# Patient Record
Sex: Male | Born: 1960 | Race: Black or African American | Hispanic: No | Marital: Single | Smoking: Current every day smoker
Health system: Southern US, Community
[De-identification: ages and names within clinical notes are randomized; demographics above are authoritative.]

## PROBLEM LIST (undated history)

## (undated) HISTORY — PX: APPENDECTOMY: SHX54

---

## 2009-01-06 ENCOUNTER — Emergency Department (HOSPITAL_COMMUNITY): Admission: EM | Admit: 2009-01-06 | Discharge: 2009-01-06 | Payer: Self-pay | Admitting: Emergency Medicine

## 2011-02-12 LAB — HIV ANTIBODY (ROUTINE TESTING W REFLEX): HIV: NONREACTIVE
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
## 2017-12-21 ENCOUNTER — Other Ambulatory Visit: Payer: Self-pay

## 2017-12-21 ENCOUNTER — Encounter: Payer: Self-pay | Admitting: Emergency Medicine

## 2017-12-21 ENCOUNTER — Emergency Department
Admission: EM | Admit: 2017-12-21 | Discharge: 2017-12-21 | Disposition: A | Discharge status: Home / Self Care | Payer: Self-pay | Attending: Student in an Organized Health Care Education/Training Program | Admitting: Student in an Organized Health Care Education/Training Program

## 2017-12-21 ENCOUNTER — Emergency Department: Payer: Self-pay

## 2017-12-21 DIAGNOSIS — F17200 Nicotine dependence, unspecified, uncomplicated: Secondary | ICD-10-CM | POA: Insufficient documentation

## 2017-12-21 DIAGNOSIS — R1013 Epigastric pain: Secondary | ICD-10-CM | POA: Insufficient documentation

## 2017-12-21 LAB — COMPREHENSIVE METABOLIC PANEL
ALBUMIN: 4.6 g/dL (ref 3.5–5.0)
ALT: 24 U/L (ref 17–63)
AST: 21 U/L (ref 15–41)
Alkaline Phosphatase: 82 U/L (ref 38–126)
Anion gap: 9 (ref 5–15)
BUN: 11 mg/dL (ref 6–20)
CHLORIDE: 105 mmol/L (ref 101–111)
CO2: 25 mmol/L (ref 22–32)
Calcium: 9.5 mg/dL (ref 8.9–10.3)
Creatinine, Ser: 0.93 mg/dL (ref 0.61–1.24)
GFR calc Af Amer: >60 mL/min (ref >60)
GFR calc non Af Amer: >60 mL/min (ref >60)
GLUCOSE: 91 mg/dL (ref 65–99)
POTASSIUM: 4.3 mmol/L (ref 3.5–5.1)
Sodium: 139 mmol/L (ref 135–145)
Total Bilirubin: 0.9 mg/dL (ref 0.3–1.2)
Total Protein: 8.7 g/dL — ABNORMAL HIGH (ref 6.5–8.1)

## 2017-12-21 LAB — URINALYSIS, COMPLETE (UACMP) WITH MICROSCOPIC
BACTERIA UA: NONE SEEN
Bilirubin Urine: NEGATIVE
Glucose, UA: NEGATIVE mg/dL
Hgb urine dipstick: NEGATIVE
Ketones, ur: 5 mg/dL — AB
Leukocytes, UA: NEGATIVE
Nitrite: NEGATIVE
Protein, ur: 30 mg/dL — AB
Specific Gravity, Urine: 1.026 (ref 1.005–1.030)
pH: 6 (ref 5.0–8.0)

## 2017-12-21 LAB — CBC
HEMATOCRIT: 42.3 % (ref 40.0–52.0)
Hemoglobin: 13.6 g/dL (ref 13.0–18.0)
MCH: 23.5 pg — AB (ref 26.0–34.0)
MCHC: 32.3 g/dL (ref 32.0–36.0)
MCV: 72.6 fL — AB (ref 80.0–100.0)
Platelets: 296 10*3/uL (ref 150–440)
RBC: 5.82 MIL/uL (ref 4.40–5.90)
RDW: 14.3 % (ref 11.5–14.5)
WBC: 6.4 10*3/uL (ref 3.8–10.6)

## 2017-12-21 LAB — LIPASE, BLOOD: Lipase: 61 U/L — ABNORMAL HIGH (ref 11–51)

## 2017-12-21 MED ORDER — HYDROCODONE-ACETAMINOPHEN 5-325 MG PO TABS: 1.0000 | ORAL_TABLET | ORAL | 0 refills | Status: DC | PRN

## 2017-12-21 MED ORDER — PROMETHAZINE HCL 12.5 MG PO TABS: 12.5000 mg | ORAL_TABLET | Freq: Four times a day (QID) | ORAL | 0 refills | Status: DC | PRN

## 2017-12-21 MED ORDER — SODIUM CHLORIDE 0.9 % IV BOLUS (SEPSIS)
1000.0000 mL | Freq: Once | INTRAVENOUS | Status: AC
  Administered 2017-12-21: 1000 mL via INTRAVENOUS

## 2017-12-21 MED ORDER — HYDROCODONE-ACETAMINOPHEN 5-325 MG PO TABS
1.0000 | ORAL_TABLET | Freq: Once | ORAL | Status: AC
  Administered 2017-12-21: 1 via ORAL
  Filled 2017-12-21: qty 1

## 2017-12-21 MED ORDER — IOPAMIDOL (ISOVUE-300) INJECTION 61%
100.0000 mL | Freq: Once | INTRAVENOUS | Status: AC | PRN
  Administered 2017-12-21: 100 mL via INTRAVENOUS

## 2017-12-21 MED ORDER — PROMETHAZINE HCL 25 MG/ML IJ SOLN
12.5000 mg | Freq: Four times a day (QID) | INTRAMUSCULAR | Status: DC | PRN
  Administered 2017-12-21: 12.5 mg via INTRAVENOUS
  Filled 2017-12-21: qty 1

## 2017-12-21 NOTE — ED Provider Notes (Signed)
[facility] Emergency Department Provider Note    First MD Initiated Contact with Patient 12/21/17 [id number]     (approximate)  I have reviewed the triage vital signs and the nursing notes.   HISTORY  Chief Complaint Abdominal Pain    HPI Francisco Miller is a 57 y.o. male status post appendectomy but no other known chronic medical conditions or recent hospitalizations presents for evaluation of 2-3 weeks of progressively worsening generalized abdominal pain with feeling of fullness.  States his been taking laxatives and feels that he has some improvement after moving his bowels but symptoms do not resolve and quickly come back.  Is not noticed any blood or melena.  Denies any fevers.  No cough or shortness of breath.  Denies any dysuria, no hematuria.  History reviewed. No pertinent past medical history. History reviewed. No pertinent family history. Past Surgical History:  Procedure Laterality Date  . APPENDECTOMY     There are no active problems to display for this patient.     Prior to Admission medications   Not on File    Allergies Patient has no known allergies.    Social History Social History   Tobacco Use  . Smoking status: Current Every Day Smoker  . Smokeless tobacco: Never Used  Substance Use Topics  . Alcohol use: Yes  . Drug use: No    Review of Systems Patient denies headaches, rhinorrhea, blurry vision, numbness, shortness of breath, chest pain, edema, cough, abdominal pain, nausea, vomiting, diarrhea, dysuria, fevers, rashes or hallucinations unless otherwise stated above in HPI. ____________________________________________   PHYSICAL EXAM:  VITAL SIGNS: Vitals:   12/21/17 0738  BP: 140/81  Pulse: 87  Resp: 18  Temp: 98.4 F (36.9 C)  SpO2: 100%    Constitutional: Alert and oriented. Well appearing and in no acute distress. Eyes: Conjunctivae are normal.  Head: Atraumatic. Nose: No  congestion/rhinnorhea. Mouth/Throat: Mucous membranes are moist.   Neck: No stridor. Painless ROM.  Cardiovascular: Normal rate, regular rhythm. Grossly normal heart sounds.  Good peripheral circulation. Respiratory: Normal respiratory effort.  No retractions. Lungs CTAB. Gastrointestinal: Soft with diffuse ttp, no peritonitis. No distention. No abdominal bruits. No CVA tenderness. Musculoskeletal: No lower extremity tenderness nor edema.  No joint effusions. Neurologic:  Normal speech and language. No gross focal neurologic deficits are appreciated. No facial droop Skin:  Skin is warm, dry and intact. No rash noted. Psychiatric: Mood and affect are normal. Speech and behavior are normal.  ____________________________________________   LABS (all labs ordered are listed, but only abnormal results are displayed)  Results for orders placed or performed during the hospital encounter of 12/21/17 (from the past 24 hour(s))  Lipase, blood     Status: Abnormal   Collection Time: 12/21/17  7:43 AM  Result Value Ref Range   Lipase 61 (H) 11 - 51 U/L  Comprehensive metabolic panel     Status: Abnormal   Collection Time: 12/21/17  7:43 AM  Result Value Ref Range   Sodium 139 135 - 145 mmol/L   Potassium 4.3 3.5 - 5.1 mmol/L   Chloride 105 101 - 111 mmol/L   CO2 25 22 - 32 mmol/L   Glucose, Bld 91 65 - 99 mg/dL   BUN 11 6 - 20 mg/dL   Creatinine, Ser 9.600.93 0.61 - 1.24 mg/dL   Calcium 9.5 8.9 - 45.410.3 mg/dL   Total Protein 8.7 (H) 6.5 - 8.1 g/dL   Albumin 4.6 3.5 - 5.0 g/dL  AST 21 15 - 41 U/L   ALT 24 17 - 63 U/L   Alkaline Phosphatase 82 38 - 126 U/L   Total Bilirubin 0.9 0.3 - 1.2 mg/dL   GFR calc non Af Amer >60 >60 mL/min   GFR calc Af Amer >60 >60 mL/min   Anion gap 9 5 - 15  CBC     Status: Abnormal   Collection Time: 12/21/17  7:43 AM  Result Value Ref Range   WBC 6.4 3.8 - 10.6 K/uL   RBC 5.82 4.40 - 5.90 MIL/uL   Hemoglobin 13.6 13.0 - 18.0 g/dL   HCT 95.6 21.3 - 08.6 %    MCV 72.6 (L) 80.0 - 100.0 fL   MCH 23.5 (L) 26.0 - 34.0 pg   MCHC 32.3 32.0 - 36.0 g/dL   RDW 57.8 46.9 - 62.9 %   Platelets 296 150 - 440 K/uL  Urinalysis, Complete w Microscopic     Status: Abnormal   Collection Time: 12/21/17  7:44 AM  Result Value Ref Range   Color, Urine YELLOW (A) YELLOW   APPearance HAZY (A) CLEAR   Specific Gravity, Urine 1.026 1.005 - 1.030   pH 6.0 5.0 - 8.0   Glucose, UA NEGATIVE NEGATIVE mg/dL   Hgb urine dipstick NEGATIVE NEGATIVE   Bilirubin Urine NEGATIVE NEGATIVE   Ketones, ur 5 (A) NEGATIVE mg/dL   Protein, ur 30 (A) NEGATIVE mg/dL   Nitrite NEGATIVE NEGATIVE   Leukocytes, UA NEGATIVE NEGATIVE   RBC / HPF 0-5 0 - 5 RBC/hpf   WBC, UA 6-30 0 - 5 WBC/hpf   Bacteria, UA NONE SEEN NONE SEEN   Squamous Epithelial / LPF 0-5 (A) NONE SEEN   Mucus PRESENT    Hyaline Casts, UA PRESENT    ____________________________________________   ____________________________________________  RADIOLOGY  I personally reviewed all radiographic images ordered to evaluate for the above acute complaints and reviewed radiology reports and findings.  These findings were personally discussed with the patient.  Please see medical record for radiology report.  ____________________________________________   PROCEDURES  Procedure(s) performed:  Procedures    Critical Care performed: no ____________________________________________   INITIAL IMPRESSION / ASSESSMENT AND PLAN / ED COURSE  Pertinent labs & imaging results that were available during my care of the patient were reviewed by me and considered in my medical decision making (see chart for details).  DDX: colitis, appy, uti, mass, sbo, enteritis, constipation  [name] is a 57 y.o. who presents to the ED with symptoms as described above.  Does have some epigastric tenderness to palpation based on duration of symptoms will order CT imaging as well as blood work for the above differential.  Will  provide IV fluids as well as pain medication and IV antiemetics.  Clinical Course as of Dec 21 1522  Tue Dec 21, 2017  1038 Patient with CT evidence of probable viral colitis with mild pancreatitis supported by marginally elevated lipase.  Will give IV fluids   [PR]    Clinical Course User Index [PR] Willy Eddy, MD    Patient reassessed and is tolerating oral hydration and pain is resolved.  At this point do believe he is stable and appropriate for outpatient management. ____________________________________________   FINAL CLINICAL IMPRESSION(S) / ED DIAGNOSES  Final diagnoses:  Epigastric pain      NEW MEDICATIONS STARTED DURING THIS VISIT:  New Prescriptions   No medications on file     Note:  This document was prepared using  [employer] and may include unintentional dictation errors.    Willy Eddy, MD 12/21/17 1525

## 2017-12-21 NOTE — Discharge Instructions (Signed)

## 2017-12-21 NOTE — ED Notes (Signed)
Pt states he is nauseated and doesn't want to eat the provided crackers and water. See mar for administered medication

## 2017-12-21 NOTE — ED Notes (Signed)
[facility]

## 2017-12-21 NOTE — ED Notes (Signed)
Patient transported to CT 

## 2017-12-21 NOTE — ED Notes (Signed)
First Nurse Note:  Patient complaining of abdominal pain with constipation, states he has a "knot" in his upper abdomen.  Taking MOM OTC with minimal relief.  Alert and oriented.

## 2017-12-21 NOTE — ED Triage Notes (Signed)
Pt here mostly lower abdominal pain that he thinks is from constipation. Last bowel movement this morning, has been using MOM and it seems to help but still gets this pain in lower abdomen.  Denies vomiting. Denies fevers.  Will report lower abdominal pain with constipation sx but then describe generalized abdominal pain as well.  Difficult to get accurate picture at this time.  VSS. Ambulatory.

## 2019-09-16 IMAGING — CT CT ABD-PELV W/ CM
2 of 5 series · 15 of 46 positions shown, 17 images · IV contrast (APPLIED)
Comparison: None.

CLINICAL DATA: Generalized abdominal pain.

EXAM:
CT ABDOMEN AND PELVIS WITH CONTRAST
TECHNIQUE: Multidetector CT imaging of the abdomen and pelvis was performed
using the standard protocol following bolus administration of
intravenous contrast.
CONTRAST:  100mL 4CXHST-Y66 IOPAMIDOL (4CXHST-Y66) INJECTION 61%

[Series 2: routine abd/pel with · axial · 0.72mm/px · z∈[-1036,-636]mm · 12 of 91 slices shown, 14 images]
[im 6/91  soft-tissue]
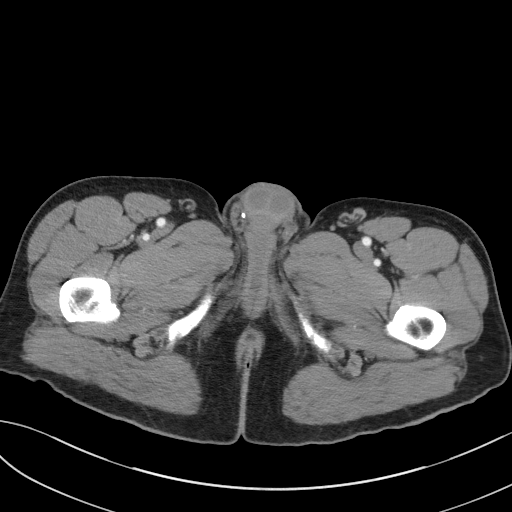
[im 6/91  bone]
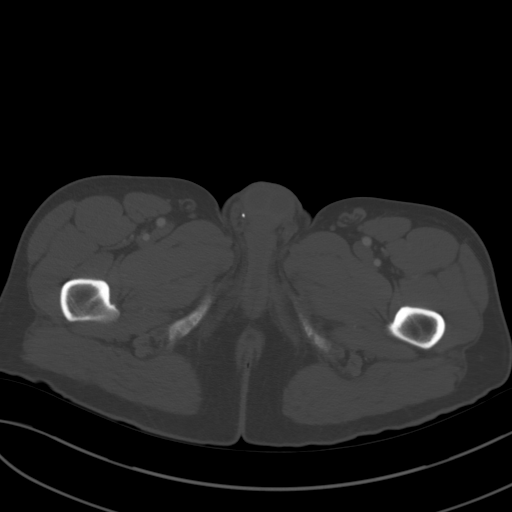
[im 16/91  soft-tissue]
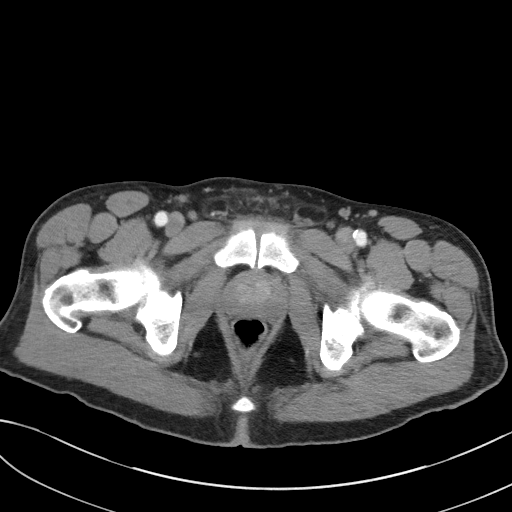
[im 21/91  soft-tissue]
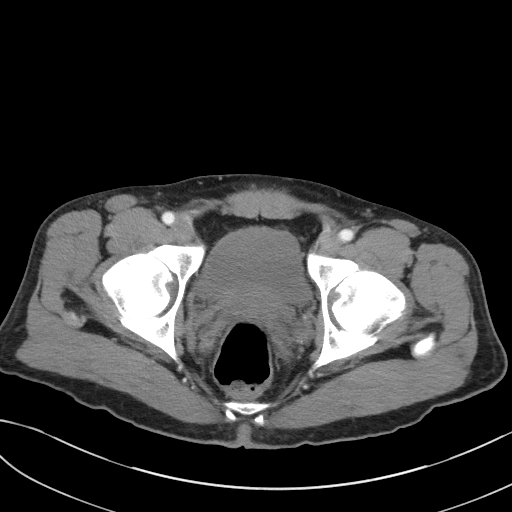
[im 26/91  soft-tissue]
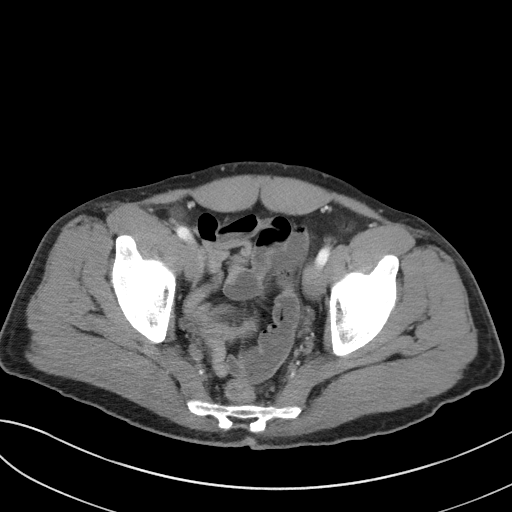
[im 36/91  soft-tissue]
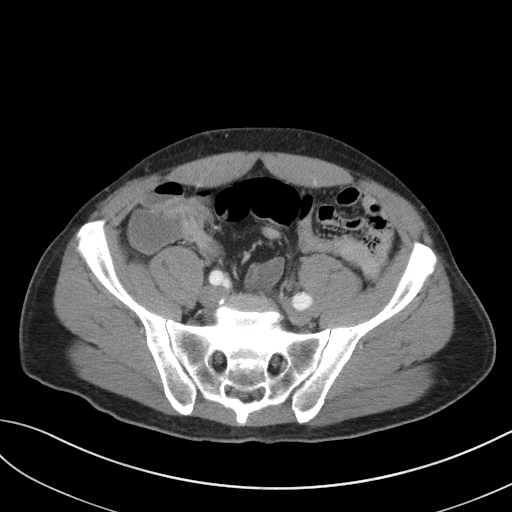
[im 41/91  soft-tissue]
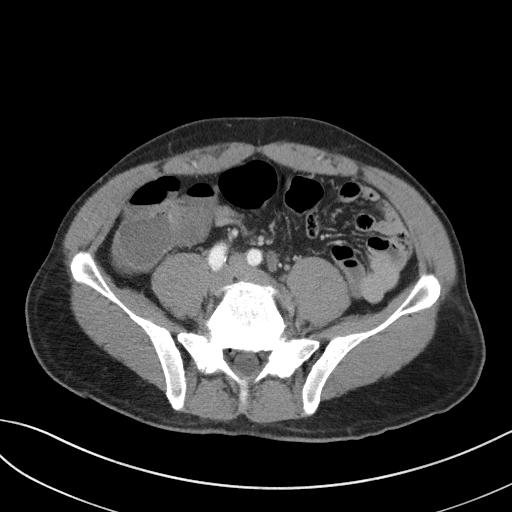
[im 51/91  soft-tissue]
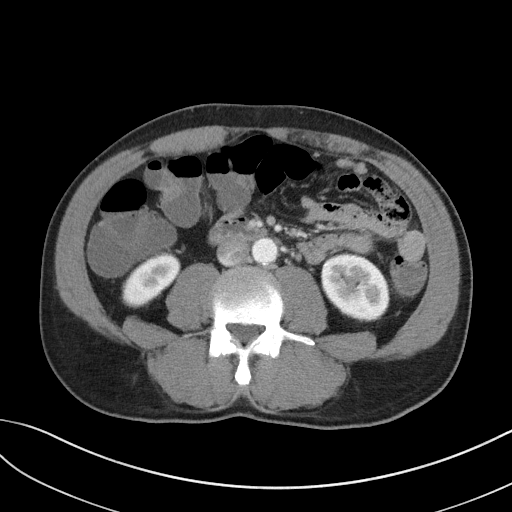
[im 56/91  soft-tissue]
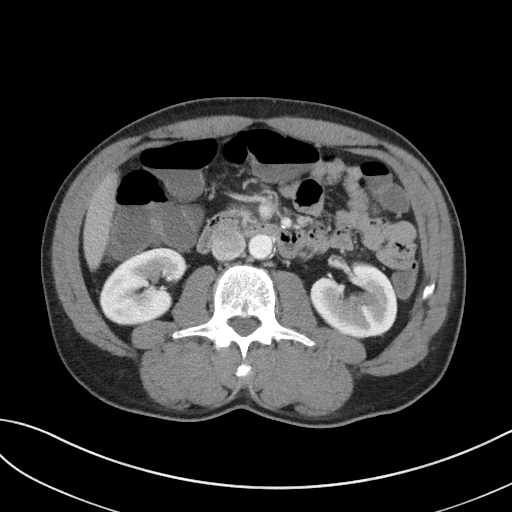
[im 66/91  soft-tissue]
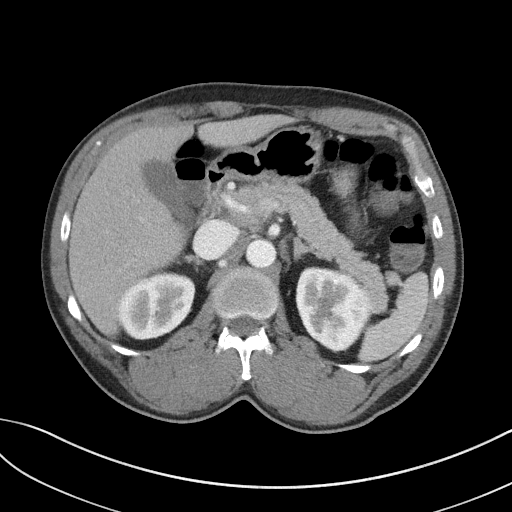
[im 66/91  bone]
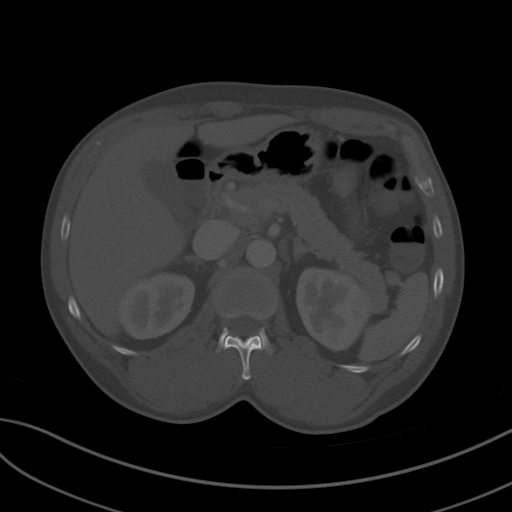
[im 71/91  soft-tissue]
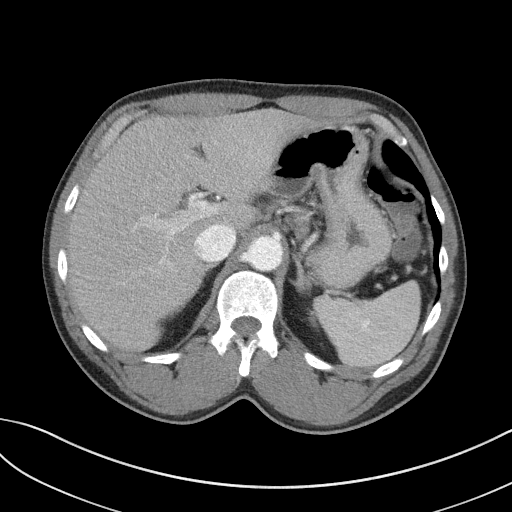
[im 76/91  soft-tissue]
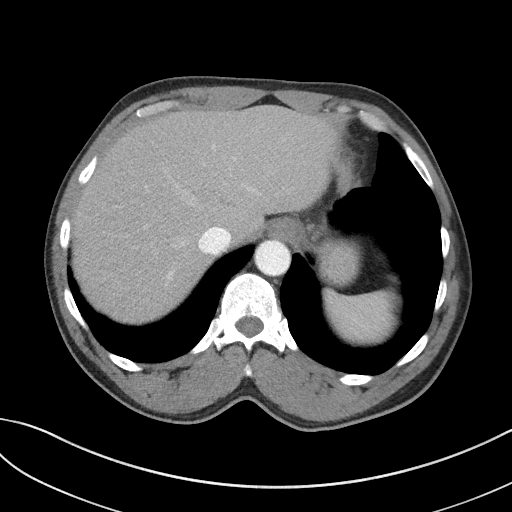
[im 86/91  soft-tissue]
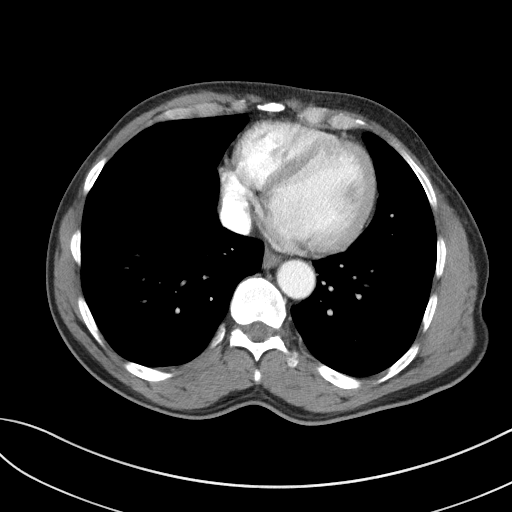

[Series 5: coronal st · coronal · 0.75mm/px · 3 of 89 slices shown]
[im 30/89  soft-tissue]
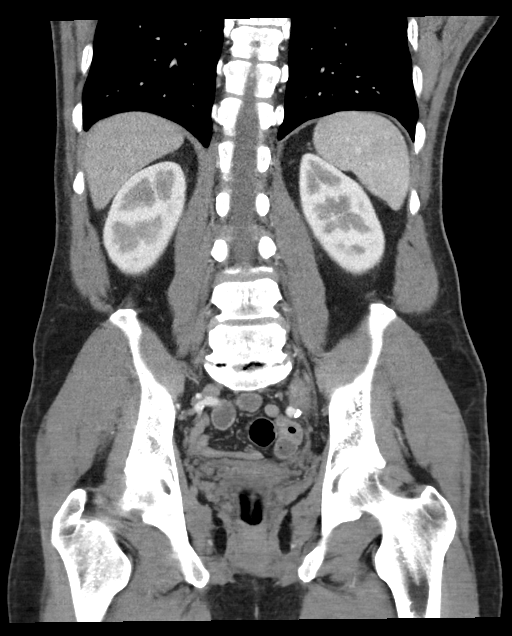
[im 40/89  soft-tissue]
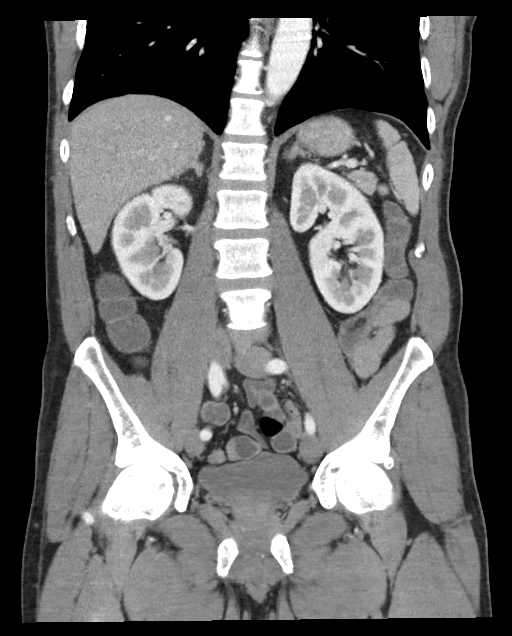
[im 49/89  soft-tissue]
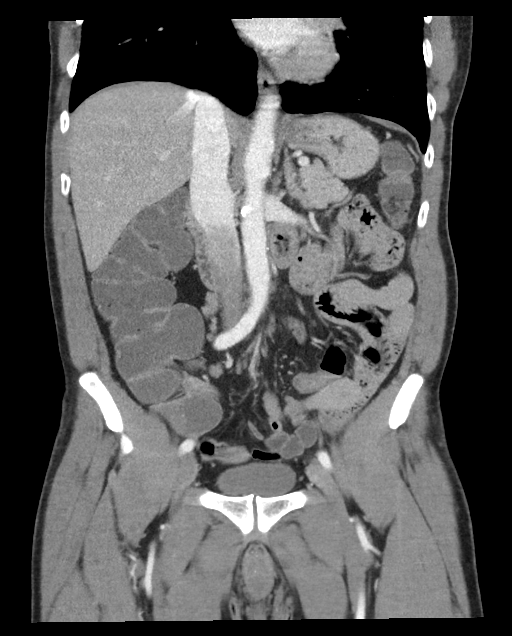

[15 of 46 positions shown; findings below may reference images not displayed]

FINDINGS: Lower chest: The lung bases are clear of acute process. No pleural
effusion or pulmonary lesions. The heart is normal in size. No
pericardial effusion. The distal esophagus and aorta are
unremarkable.

Hepatobiliary: No focal hepatic lesions or intrahepatic biliary
dilatation. The gallbladder is normal. No common bile duct
dilatation.

Pancreas: The pancreatic head appears slightly enlarged and possibly
edematous. There also appears to be some mild inflammation around
the pancreatic head with some blurring of the fat. Possible mild
acute focal pancreatitis. Recommend correlation with clinical
findings. No masses identified. No pancreatic ductal dilatation.

Spleen: Normal size.  No focal lesions.

Adrenals/Urinary Tract: The adrenal glands and kidneys are normal.
The bladder is normal.

Stomach/Bowel: The stomach, duodenum and small bowel are
unremarkable. No acute inflammatory changes, mass lesions or
obstructive findings. The colon is distended and fluid-filled,
typical findings for diarrhea but I do not see any wall thickening
or pericolonic inflammatory changes. The terminal ileum is
unremarkable. The appendix is surgically absent.

Vascular/Lymphatic: Scattered atherosclerotic calcifications
involving the aorta and iliac arteries but no aneurysm or
dissection. The branch vessels are patent. The major venous
structures are patent.

Small scattered mesenteric and retroperitoneal lymph nodes but no
mass or overt adenopathy the.

Reproductive: The prostate gland and seminal vesicles are
unremarkable.

Other: No pelvic mass or adenopathy. No free pelvic fluid
collections. No inguinal mass or adenopathy. No abdominal wall
hernia or subcutaneous lesions.

Musculoskeletal: The no significant bony findings. Advanced
degenerative disc disease noted at L5-S1
IMPRESSION: 1. Findings suspicious for mild pancreatitis involving the
pancreatic head. Recommend clinical correlation.
2. Distended fluid-filled colon typically seen with diarrhea. No
obvious acute colitis.
3. No abdominal mass or lymphadenopathy.

## 2024-03-17 NOTE — Progress Notes (Unsigned)
 Sleep Medicine   Office Visit  Patient Name: Francisco Miller DOB: June 06, 1961 MRN [id number]    Chief Complaint: history of OSA   Brief History:  [name] presents for an initial consult for sleep evaluation and to establish care. Patient has a 11 year history of sleep apnea but has not used a pap since 2016. Sleep quality is fair. This is noted most night. The patient's bed partner reports  snoring, gasping, witnessed apneic pauses at night. The patient relates the following symptoms: some brain fog and fatigue are also present. The patient works nocturnal hours and goes to sleep at 1000 am and wakes up at 0200 pm.  Sleep quality is the same when outside home environment.  Patient has noted some movement of his legs at night that would disrupt his sleep.  The patient  relates some sleep talking as unusual behavior during the night.  The patient relates no history of psychiatric problems. The Epworth Sleepiness Score is 5 out of 24 .  The patient relates  Cardiovascular risk factors include: none.     ROS  General: (-) fever, (-) chills, (-) night sweat Nose and Sinuses: (-) nasal stuffiness or itchiness, (-) postnasal drip, (-) nosebleeds, (-) sinus trouble. Mouth and Throat: (-) sore throat, (-) hoarseness. Neck: (-) swollen glands, (-) enlarged thyroid, (-) neck pain. Respiratory: - cough, - shortness of breath, - wheezing. Neurologic: - numbness, - tingling. Psychiatric: - anxiety, - depression Sleep behavior: -sleep paralysis -hypnogogic hallucinations -dream enactment      -vivid dreams -cataplexy -night terrors -sleep walking   Current Medication: Outpatient Encounter Medications as of 03/20/2024  Medication Sig   [DISCONTINUED] HYDROcodone -acetaminophen  (NORCO) 5-325 MG tablet Take 1 tablet by mouth every 4 (four) hours as needed for moderate pain.   [DISCONTINUED] promethazine  (PHENERGAN ) 12.5 MG tablet Take 1 tablet (12.5 mg total) by mouth every 6 (six) hours as needed for  nausea or vomiting.   No facility-administered encounter medications on file as of 03/20/2024.    Surgical History: Past Surgical History:  Procedure Laterality Date   APPENDECTOMY      Medical History: History reviewed. No pertinent past medical history.  Family History: Non contributory to the present illness  Social History: Social History   Socioeconomic History   Marital status: Single    Spouse name: Not on file   Number of children: Not on file   Years of education: Not on file   Highest education level: Not on file  Occupational History   Not on file  Tobacco Use   Smoking status: Every Day   Smokeless tobacco: Never  Substance and Sexual Activity   Alcohol use: Yes   Drug use: No   Sexual activity: Not on file  Other Topics Concern   Not on file  Social History Narrative   Not on file   Social Drivers of Health   Financial Resource Strain: Not on file  Food Insecurity: Not on file  Transportation Needs: Not on file  Physical Activity: Not on file  Stress: Not on file  Social Connections: Not on file  Intimate Partner Violence: Not on file    Vital Signs: Blood pressure 114/81, pulse (!) 59, resp. rate 16, height 5\' 7"  (1.702 m), weight 149 lb (67.6 kg), SpO2 98%. Body mass index is 23.34 kg/m.   Examination: General Appearance: The patient is well-developed, well-nourished, and in no distress. Neck Circumference: 38 cm Skin: Gross inspection of skin unremarkable. Head: normocephalic, no gross deformities. Eyes:  no gross deformities noted. ENT: ears appear grossly normal Neurologic: Alert and oriented. No involuntary movements.    STOP BANG RISK ASSESSMENT S (snore) Have you been told that you snore?     YES   T (tired) Are you often tired, fatigued, or sleepy during the day?   YES  O (obstruction) Do you stop breathing, choke, or gasp during sleep? YES   P (pressure) Do you have or are you being treated for high blood pressure? NO    B (BMI) Is your body index greater than 35 kg/m? NO   A (age) Are you 63 years old or older? YES   N (neck) Do you have a neck circumference greater than 16 inches?   NO   G (gender) Are you a male? YES   TOTAL STOP/BANG "YES" ANSWERS 5                                                               A STOP-Bang score of 2 or less is considered low risk, and a score of 5 or more is high risk for having either moderate or severe OSA. For people who score 3 or 4, doctors may need to perform further assessment to determine how likely they are to have OSA.         EPWORTH SLEEPINESS SCALE:  Scale:  (0)= no chance of dozing; (1)= slight chance of dozing; (2)= moderate chance of dozing; (3)= high chance of dozing  Chance  Situtation    Sitting and reading: 0    Watching TV: 1    Sitting Inactive in public: 1    As a passenger in car: 0      Lying down to rest: 2    Sitting and talking: 1    Sitting quielty after lunch: 0    In a car, stopped in traffic: 0   TOTAL SCORE:   5 out of 24    SLEEP STUDIES:  02/14/16 Split study AHI 111.6 o2 nadir was 77%.    LABS: No results found for this or any previous visit (from the past 2160 hours).  Radiology: CT ABDOMEN PELVIS W CONTRAST Result Date: 12/21/2017 CLINICAL DATA:  Generalized abdominal pain. EXAM: CT ABDOMEN AND PELVIS WITH CONTRAST TECHNIQUE: Multidetector CT imaging of the abdomen and pelvis was performed using the standard protocol following bolus administration of intravenous contrast. CONTRAST:  100mL ISOVUE -300 IOPAMIDOL  (ISOVUE -300) INJECTION 61% COMPARISON:  None. FINDINGS: Lower chest: The lung bases are clear of acute process. No pleural effusion or pulmonary lesions. The heart is normal in size. No pericardial effusion. The distal esophagus and aorta are unremarkable. Hepatobiliary: No focal hepatic lesions or intrahepatic biliary dilatation. The gallbladder is normal. No common bile duct dilatation. Pancreas:  The pancreatic head appears slightly enlarged and possibly edematous. There also appears to be some mild inflammation around the pancreatic head with some blurring of the fat. Possible mild acute focal pancreatitis. Recommend correlation with clinical findings. No masses identified. No pancreatic ductal dilatation. Spleen: Normal size.  No focal lesions. Adrenals/Urinary Tract: The adrenal glands and kidneys are normal. The bladder is normal. Stomach/Bowel: The stomach, duodenum and small bowel are unremarkable. No acute inflammatory changes, mass lesions or obstructive findings. The colon is distended and fluid-filled, typical findings  for diarrhea but I do not see any wall thickening or pericolonic inflammatory changes. The terminal ileum is unremarkable. The appendix is surgically absent. Vascular/Lymphatic: Scattered atherosclerotic calcifications involving the aorta and iliac arteries but no aneurysm or dissection. The branch vessels are patent. The major venous structures are patent. Small scattered mesenteric and retroperitoneal lymph nodes but no mass or overt adenopathy the. Reproductive: The prostate gland and seminal vesicles are unremarkable. Other: No pelvic mass or adenopathy. No free pelvic fluid collections. No inguinal mass or adenopathy. No abdominal wall hernia or subcutaneous lesions. Musculoskeletal: The no significant bony findings. Advanced degenerative disc disease noted at L5-S1 IMPRESSION: 1. Findings suspicious for mild pancreatitis involving the pancreatic head. Recommend clinical correlation. 2. Distended fluid-filled colon typically seen with diarrhea. No obvious acute colitis. 3. No abdominal mass or lymphadenopathy. Electronically Signed   By: Marrian Siva M.D.   On: 12/21/2017 10:32    No results found.  No results found.    Assessment and Plan: Patient Active Problem List   Diagnosis Date Noted   OSA (obstructive sleep apnea) 03/20/2024   Smoker 03/20/2024   1. OSA  (obstructive sleep apnea) (Primary) PLAN OSA:   Patient evaluation suggests high risk of sleep disordered breathing due to history of severe OSA based on prior sleep studies Suggest: PSG  to assess the patient's sleep disordered breathing.    2. Smoker Smoking cessation counseling: Pt acknowledges the risks of long term smoking, she will try to quite smoking. Options for different medications including nicotine products, chewing gum, patch etc, Wellbutrin and Chantix is discussed Goal and date of compete cessation is discussed Total time spent in smoking cessation is 10 min.      General Counseling: I have discussed the findings of the evaluation and examination with Hewitt Lou.  I have also discussed any further diagnostic evaluation thatmay be needed or ordered today. Joquan verbalizes understanding of the findings of todays visit. We also reviewed his medications today and discussed drug interactions and side effects including but not limited excessive drowsiness and altered mental states. We also discussed that there is always a risk not just to him but also people around him. he has been encouraged to call the office with any questions or concerns that should arise related to todays visit.  No orders of the defined types were placed in this encounter.       I have personally obtained a history, evaluated the patient, evaluated pertinent data, formulated the assessment and plan and placed orders.  This patient was seen today by Louann Rous, PA-C in collaboration with Dr. Cam Cava.    Cordie Deters, MD [facility] Diplomate ABMS Pulmonary and Critical Care Medicine Sleep medicine

## 2024-03-20 ENCOUNTER — Ambulatory Visit (INDEPENDENT_AMBULATORY_CARE_PROVIDER_SITE_OTHER): Admitting: Internal Medicine

## 2024-03-20 VITALS — BP 114/81 | HR 59 | Resp 16 | Ht 67.0 in | Wt 149.0 lb

## 2024-03-20 DIAGNOSIS — F17200 Nicotine dependence, unspecified, uncomplicated: Secondary | ICD-10-CM | POA: Insufficient documentation

## 2024-03-20 DIAGNOSIS — G4733 Obstructive sleep apnea (adult) (pediatric): Secondary | ICD-10-CM | POA: Insufficient documentation

## 2024-03-20 NOTE — Patient Instructions (Signed)
 Living With Sleep Apnea Sleep apnea is a condition that affects your breathing while you're sleeping. Your tongue or the tissue in your throat may block the flow of air while you sleep. You may have shallow breathing or stop breathing for short periods of time. The breaks in breathing interrupt the deep sleep that you need to feel rested. Even if you don't wake up from the gaps in breathing, you may feel tired during the day. People with sleep apnea may snore loudly. You may have a headache in the morning and feel anxious or depressed. How can sleep apnea affect me? Sleep apnea increases your chances of being very tired during the day. This is called daytime fatigue. Sleep apnea can also increase your risk of: Heart attack. Stroke. Obesity. Type 2 diabetes. Heart failure. Irregular heartbeat. High blood pressure. If you are very tired during the day, you may be more likely to: Not do well in school or at work. Fall asleep while driving. Have trouble paying attention. Develop depression or anxiety. Have problems having sex. This is called sexual dysfunction. What actions can I take to manage sleep apnea? Sleep apnea treatment  If you were given a device to open your airway while you sleep, use it only as told by your health care provider. You may be given: An oral appliance. This is a mouthpiece that shifts your lower jaw forward. A continuous positive airway pressure (CPAP) device. This blows air through a mask. A nasal expiratory positive airway pressure (EPAP) device. This has valves that you put into each nostril. A bi-level positive airway pressure (BIPAP) device. This blows air through a mask when you breathe in and breathe out. You may need surgery if other treatments don't work for you. Sleep habits Go to sleep and wake up at the same time every day. This helps set your internal clock for sleeping. If you stay up later than usual on weekends, try to get up in the morning within 2  hours of the time you usually wake up. Try to get at least 7-9 hours of sleep each night. Stop using a computer, tablet, and mobile phone a few hours before bedtime. Do not take long naps during the day. If you nap, limit it to 30 minutes. Have a relaxing bedtime routine. Reading or listening to music may relax you and help you sleep. Use your bedroom only for sleep. Keep your television and computer out of your bedroom. Keep your bedroom cool, dark, and quiet. Use a supportive mattress and pillows. Follow your provider's instructions for other changes to sleep habits. Nutrition Do not eat big meals in the evening. Do not have caffeine in the later part of the day. The effects of caffeine can last for more than 5 hours. Follow your provider's instructions for any changes to what you eat and drink. Lifestyle Do not drink alcohol before bedtime. Alcohol can cause you to fall asleep at first, but then it can cause you to wake up in the middle of the night and have trouble getting back to sleep. Do not smoke, vape, or use nicotine or tobacco. Medicines Take over-the-counter and prescription medicines only as told by your provider. Do not use over-the-counter sleep medicine. You may become dependent on this medicine, and it can make sleep apnea worse. Do not take medicines, such as sedatives and narcotics, unless told to by your provider. Activity Exercise on most days, but avoid exercising in the evening. Exercising near bedtime can interfere with sleeping.  If possible, spend time outside every day. Natural light helps with your internal clock. General information Lose weight if you need to. Stay at a healthy weight. If you are having surgery, make sure to tell your provider that you have sleep apnea. You may need to bring your device with you. Keep all follow-up visits. Your provider will want to check on your condition. Where to find more information National Heart, Lung, and Blood  Institute: [employer] This information is not intended to replace advice given to you by your health care provider. Make sure you discuss any questions you have with your health care provider. Document Revised: 02/10/2023 Document Reviewed: 02/10/2023 Elsevier Patient Education  2024 [employer].

## 2024-06-07 ENCOUNTER — Encounter

## 2024-06-28 ENCOUNTER — Encounter

## 2024-07-05 ENCOUNTER — Ambulatory Visit

## 2024-07-05 DIAGNOSIS — D122 Benign neoplasm of ascending colon: Secondary | ICD-10-CM | POA: Diagnosis not present

## 2024-07-05 DIAGNOSIS — K2950 Unspecified chronic gastritis without bleeding: Secondary | ICD-10-CM | POA: Diagnosis not present

## 2024-07-05 DIAGNOSIS — K640 First degree hemorrhoids: Secondary | ICD-10-CM | POA: Diagnosis not present

## 2024-07-05 DIAGNOSIS — K3189 Other diseases of stomach and duodenum: Secondary | ICD-10-CM | POA: Diagnosis not present

## 2024-07-05 DIAGNOSIS — K6389 Other specified diseases of intestine: Secondary | ICD-10-CM | POA: Diagnosis not present

## 2024-07-05 DIAGNOSIS — D124 Benign neoplasm of descending colon: Secondary | ICD-10-CM | POA: Diagnosis not present

## 2024-07-05 DIAGNOSIS — B9681 Helicobacter pylori [H. pylori] as the cause of diseases classified elsewhere: Secondary | ICD-10-CM | POA: Diagnosis not present

## 2024-08-11 NOTE — Progress Notes (Unsigned)
 [facility] [address] [place], [place] [address]  Pulmonary Sleep Medicine   Office Visit Note  Patient Name: Francisco Miller DOB: 1961/09/17 MRN [id number]    Chief Complaint: Obstructive Sleep Apnea visit  Brief History:  Shammond is seen today for a follow up visit for CPAP@ 8 cmH2O. The patient has a 11 year history of sleep apnea. Patient is not using PAP nightly.  The patient feels rested after sleeping with PAP.  The patient reports benefiting from PAP use. Reported sleepiness is improved and the Epworth Sleepiness Score is 3 out of 24. The patient does take naps. The patient complains of the following: pt works nocturnal hours and will sometimes forget to wear his PAP before dozing off. Patient will attempt to meet compliance by next follow up visit. The compliance download shows 43% compliance with an average use time of 4 hours 41 minutes. The AHI is 11.  The patient does not complain of limb movements disrupting sleep. The patient continues to require PAP therapy in order to eliminate sleep apnea.   ROS  General: (-) fever, (-) chills, (-) night sweat Nose and Sinuses: (-) nasal stuffiness or itchiness, (-) postnasal drip, (-) nosebleeds, (-) sinus trouble. Mouth and Throat: (-) sore throat, (-) hoarseness. Neck: (-) swollen glands, (-) enlarged thyroid, (-) neck pain. Respiratory: - cough, - shortness of breath, - wheezing. Neurologic: - numbness, - tingling. Psychiatric: - anxiety, - depression   Current Medication: No outpatient encounter medications on file as of 08/14/2024.   No facility-administered encounter medications on file as of 08/14/2024.    Surgical History: Past Surgical History:  Procedure Laterality Date   APPENDECTOMY      Medical History: No past medical history on file.  Family History: Non contributory to the present illness  Social History: Social History   Socioeconomic History   Marital status: Single    Spouse  name: Not on file   Number of children: Not on file   Years of education: Not on file   Highest education level: Not on file  Occupational History   Not on file  Tobacco Use   Smoking status: Every Day   Smokeless tobacco: Never  Substance and Sexual Activity   Alcohol use: Yes   Drug use: No   Sexual activity: Not on file  Other Topics Concern   Not on file  Social History Narrative   Not on file   Social Drivers of Health   Financial Resource Strain: Not on file  Food Insecurity: Not on file  Transportation Needs: Not on file  Physical Activity: Not on file  Stress: Not on file  Social Connections: Not on file  Intimate Partner Violence: Not on file    Vital Signs: Blood pressure 126/86, pulse 91, resp. rate 16, height 5' 7 (1.702 m), SpO2 97%. Body mass index is 23.34 kg/m.    Examination: General Appearance: The patient is well-developed, well-nourished, and in no distress. Neck Circumference: 38 cm Skin: Gross inspection of skin unremarkable. Head: normocephalic, no gross deformities. Eyes: no gross deformities noted. ENT: ears appear grossly normal Neurologic: Alert and oriented. No involuntary movements.  STOP BANG RISK ASSESSMENT S (snore) Have you been told that you snore?     NO   T (tired) Are you often tired, fatigued, or sleepy during the day?   YES  O (obstruction) Do you stop breathing, choke, or gasp during sleep? NO   P (pressure) Do you have or are you  being treated for high blood pressure? NO   B (BMI) Is your body index greater than 35 kg/m? NO   A (age) Are you 63 years old or older? YES   N (neck) Do you have a neck circumference greater than 16 inches?   NO   G (gender) Are you a male? YES   TOTAL STOP/BANG "YES" ANSWERS 3       A STOP-Bang score of 2 or less is considered low risk, and a score of 5 or more is high risk for having either moderate or severe OSA. For people who score 3 or 4, doctors may need to perform further  assessment to determine how likely they are to have OSA.         EPWORTH SLEEPINESS SCALE:  Scale:  (0)= no chance of dozing; (1)= slight chance of dozing; (2)= moderate chance of dozing; (3)= high chance of dozing  Chance  Situtation    Sitting and reading: 0    Watching TV: 1    Sitting Inactive in public: 0    As a passenger in car: 0      Lying down to rest: 2    Sitting and talking: 0    Sitting quielty after lunch: 0    In a car, stopped in traffic: 0   TOTAL SCORE:   3 out of 24    SLEEP STUDIES:  Split Study (02/2016) AHI 111.6/Hr, min SpO2 77%  PSG (04/2024) AHI 24.3/Hr, Supine AHI 48.1/Hr, min SpO2 90% Titration (05/2024) CPAP@ 8 cmH2O   CPAP COMPLIANCE DATA:  Date Range: 06/15/2024-08/07/2024  Average Daily Use: 4 hours 41 minutes  Median Use: 4 hours 21 minutes  Compliance for > 4 Hours: 43%  AHI: 11 respiratory events per hour  Days Used: 41/54 days  Mask Leak: 0.9  95th Percentile Pressure: 8         LABS: No results found for this or any previous visit (from the past 2160 hours).  Radiology: CT ABDOMEN PELVIS W CONTRAST Result Date: 12/21/2017 CLINICAL DATA:  Generalized abdominal pain. EXAM: CT ABDOMEN AND PELVIS WITH CONTRAST TECHNIQUE: Multidetector CT imaging of the abdomen and pelvis was performed using the standard protocol following bolus administration of intravenous contrast. CONTRAST:  100mL ISOVUE -300 IOPAMIDOL  (ISOVUE -300) INJECTION 61% COMPARISON:  None. FINDINGS: Lower chest: The lung bases are clear of acute process. No pleural effusion or pulmonary lesions. The heart is normal in size. No pericardial effusion. The distal esophagus and aorta are unremarkable. Hepatobiliary: No focal hepatic lesions or intrahepatic biliary dilatation. The gallbladder is normal. No common bile duct dilatation. Pancreas: The pancreatic head appears slightly enlarged and possibly edematous. There also appears to be some mild inflammation  around the pancreatic head with some blurring of the fat. Possible mild acute focal pancreatitis. Recommend correlation with clinical findings. No masses identified. No pancreatic ductal dilatation. Spleen: Normal size.  No focal lesions. Adrenals/Urinary Tract: The adrenal glands and kidneys are normal. The bladder is normal. Stomach/Bowel: The stomach, duodenum and small bowel are unremarkable. No acute inflammatory changes, mass lesions or obstructive findings. The colon is distended and fluid-filled, typical findings for diarrhea but I do not see any wall thickening or pericolonic inflammatory changes. The terminal ileum is unremarkable. The appendix is surgically absent. Vascular/Lymphatic: Scattered atherosclerotic calcifications involving the aorta and iliac arteries but no aneurysm or dissection. The branch vessels are patent. The major venous structures are patent. Small scattered mesenteric and retroperitoneal lymph nodes but no mass or  overt adenopathy the. Reproductive: The prostate gland and seminal vesicles are unremarkable. Other: No pelvic mass or adenopathy. No free pelvic fluid collections. No inguinal mass or adenopathy. No abdominal wall hernia or subcutaneous lesions. Musculoskeletal: The no significant bony findings. Advanced degenerative disc disease noted at L5-S1 IMPRESSION: 1. Findings suspicious for mild pancreatitis involving the pancreatic head. Recommend clinical correlation. 2. Distended fluid-filled colon typically seen with diarrhea. No obvious acute colitis. 3. No abdominal mass or lymphadenopathy. Electronically Signed   By: MYRTIS Stammer M.D.   On: 12/21/2017 10:32    No results found.  No results found.    Assessment and Plan: Patient Active Problem List   Diagnosis Date Noted   CPAP use counseling 08/14/2024   OSA (obstructive sleep apnea) 03/20/2024   Smoker 03/20/2024    1. OSA (obstructive sleep apnea) (Primary) The patient does tolerate PAP and reports   benefit from PAP use. His apnea is not optimally controlled. I will change his pressure to APAP 8-15 with a 2 week download. The patient was reminded how to clean equipment and advised to replace supplies routinely. The patient was also counselled on smoking. The compliance is poor, he was coached on increasing use. The AHI is 11.   OSA- not optimally controlled. Change to APAP 8-15. 2 week download. F/u 14m  2. CPAP use counseling CPAP Counseling: had a lengthy discussion with the patient regarding the importance of PAP therapy in management of the sleep apnea. Patient appears to understand the risk factor reduction and also understands the risks associated with untreated sleep apnea. Patient will try to make a good faith effort to remain compliant with therapy. Also instructed the patient on proper cleaning of the device including the water must be changed daily if possible and use of distilled water is preferred. Patient understands that the machine should be regularly cleaned with appropriate recommended cleaning solutions that do not damage the PAP machine for example given white vinegar and water rinses. Other methods such as ozone treatment may not be as good as these simple methods to achieve cleaning.    General Counseling: I have discussed the findings of the evaluation and examination with Ubaldo.  I have also discussed any further diagnostic evaluation thatmay be needed or ordered today. Yash verbalizes understanding of the findings of todays visit. We also reviewed his medications today and discussed drug interactions and side effects including but not limited excessive drowsiness and altered mental states. We also discussed that there is always a risk not just to him but also people around him. he has been encouraged to call the office with any questions or concerns that should arise related to todays visit.  No orders of the defined types were placed in this encounter.       I have  personally obtained a history, examined the patient, evaluated laboratory and imaging results, formulated the assessment and plan and placed orders. This patient was seen today by Lauraine Lay, PA-C in collaboration with Dr. Elfreda Bathe.   Elfreda DELENA Bathe, MD [facility] Diplomate ABMS Pulmonary Critical Care Medicine and Sleep Medicine

## 2024-08-14 ENCOUNTER — Ambulatory Visit (INDEPENDENT_AMBULATORY_CARE_PROVIDER_SITE_OTHER): Admitting: Internal Medicine

## 2024-08-14 VITALS — BP 126/86 | HR 91 | Resp 16 | Ht 67.0 in

## 2024-08-14 DIAGNOSIS — Z7189 Other specified counseling: Secondary | ICD-10-CM | POA: Diagnosis not present

## 2024-08-14 DIAGNOSIS — G4733 Obstructive sleep apnea (adult) (pediatric): Secondary | ICD-10-CM

## 2024-08-14 NOTE — Patient Instructions (Signed)

## 2024-11-11 NOTE — Progress Notes (Unsigned)
 [facility] [address] [place], [place] [address]  Pulmonary Sleep Medicine   Office Visit Note  Patient Name: Francisco Miller DOB: 02/07/1961 MRN [id number]    Chief Complaint: Obstructive Sleep Apnea visit  Brief History:  Francisco Miller is seen today for a 3 month follow up on APAP @ 8-15 cmH2O. The patient has a 11 year history of sleep apnea. Patient is not consistently using PAP nightly.  The patient feels *** after sleeping with PAP.  The patient reports *** from PAP use. Reported sleepiness is  *** and the Epworth Sleepiness Score is *** out of 24. The patient *** take naps. The patient complains of the following: ***  The compliance download shows 69% compliance with an average use time of 4 hours 55 minutes. The AHI is 3.3  The patient *** of limb movements disrupting sleep.  ROS  General: (-) fever, (-) chills, (-) night sweat Nose and Sinuses: (-) nasal stuffiness or itchiness, (-) postnasal drip, (-) nosebleeds, (-) sinus trouble. Mouth and Throat: (-) sore throat, (-) hoarseness. Neck: (-) swollen glands, (-) enlarged thyroid, (-) neck pain. Respiratory: *** cough, *** shortness of breath, *** wheezing. Neurologic: *** numbness, *** tingling. Psychiatric: *** anxiety, *** depression   Current Medication: No outpatient encounter medications on file as of 11/13/2024.   No facility-administered encounter medications on file as of 11/13/2024.    Surgical History: Past Surgical History:  Procedure Laterality Date   APPENDECTOMY      Medical History: No past medical history on file.  Family History: Non contributory to the present illness  Social History: Social History   Socioeconomic History   Marital status: Single    Spouse name: Not on file   Number of children: Not on file   Years of education: Not on file   Highest education level: Not on file  Occupational History   Not on file  Tobacco Use   Smoking status: Every Day   Smokeless  tobacco: Never  Substance and Sexual Activity   Alcohol use: Yes   Drug use: No   Sexual activity: Not on file  Other Topics Concern   Not on file  Social History Narrative   Not on file   Social Drivers of Health   Tobacco Use: High Risk (03/20/2024)   Patient History    Smoking Tobacco Use: Every Day    Smokeless Tobacco Use: Never    Passive Exposure: Not on file  Financial Resource Strain: Not on file  Food Insecurity: Not on file  Transportation Needs: Not on file  Physical Activity: Not on file  Stress: Not on file  Social Connections: Not on file  Intimate Partner Violence: Not on file  Depression (EYV7-0): Not on file  Alcohol Screen: Not on file  Housing: Not on file  Utilities: Not on file  Health Literacy: Not on file    Vital Signs: There were no vitals taken for this visit. There is no height or weight on file to calculate BMI.    Examination: General Appearance: The patient is well-developed, well-nourished, and in no distress. Neck Circumference: *** Skin: Gross inspection of skin unremarkable. Head: normocephalic, no gross deformities. Eyes: no gross deformities noted. ENT: ears appear grossly normal Neurologic: Alert and oriented. No involuntary movements.  STOP BANG RISK ASSESSMENT S (snore) Have you been told that you snore?     YES/NO   T (tired) Are you often tired, fatigued, or sleepy during the day?   YES/NO  O (obstruction) Do you stop breathing, choke, or gasp during sleep? YES/NO   P (pressure) Do you have or are you being treated for high blood pressure? YES/NO   B (BMI) Is your body index greater than 35 kg/m? YES/NO   A (age) Are you 30 years old or older? YES   N (neck) Do you have a neck circumference greater than 16 inches?   YES/NO   G (gender) Are you a male? YES   TOTAL STOP/BANG YES ANSWERS        A STOP-Bang score of 2 or less is considered low risk, and a score of 5 or more is high risk for having either moderate  or severe OSA. For people who score 3 or 4, doctors may need to perform further assessment to determine how likely they are to have OSA.         EPWORTH SLEEPINESS SCALE:  Scale:  (0)= no chance of dozing; (1)= slight chance of dozing; (2)= moderate chance of dozing; (3)= high chance of dozing  Chance  Situtation    Sitting and reading: ***    Watching TV: ***    Sitting Inactive in public: ***    As a passenger in car: ***      Lying down to rest: ***    Sitting and talking: ***    Sitting quielty after lunch: ***    In a car, stopped in traffic: ***   TOTAL SCORE:   *** out of 24    SLEEP STUDIES:  Split Study (02/2016) AHI 111.6/hr, min Sp02 77% PSG (04/2024) AHI 24.3/hr, Supine AHI 48.1/hr, min Sp02 90% Titration (05/2024) CPAP @ 8 cmH2O   CPAP COMPLIANCE DATA:  Date Range: 08/13/2024 - 10/19/2025  Average Daily Use: 5 hours 40 minutes  Median Use: 5 hours 22 minutes  Compliance for > 4 Hours: 69% days  AHI: 3.3 respiratory events per hour  Days Used: 59/68  Mask Leak: 1.2  95th Percentile Pressure: 14.4 cmH2O         LABS: No results found for this or any previous visit (from the past 2160 hours).  Radiology: CT ABDOMEN PELVIS W CONTRAST Result Date: 12/21/2017 CLINICAL DATA:  Generalized abdominal pain. EXAM: CT ABDOMEN AND PELVIS WITH CONTRAST TECHNIQUE: Multidetector CT imaging of the abdomen and pelvis was performed using the standard protocol following bolus administration of intravenous contrast. CONTRAST:  100mL ISOVUE -300 IOPAMIDOL  (ISOVUE -300) INJECTION 61% COMPARISON:  None. FINDINGS: Lower chest: The lung bases are clear of acute process. No pleural effusion or pulmonary lesions. The heart is normal in size. No pericardial effusion. The distal esophagus and aorta are unremarkable. Hepatobiliary: No focal hepatic lesions or intrahepatic biliary dilatation. The gallbladder is normal. No common bile duct dilatation. Pancreas: The  pancreatic head appears slightly enlarged and possibly edematous. There also appears to be some mild inflammation around the pancreatic head with some blurring of the fat. Possible mild acute focal pancreatitis. Recommend correlation with clinical findings. No masses identified. No pancreatic ductal dilatation. Spleen: Normal size.  No focal lesions. Adrenals/Urinary Tract: The adrenal glands and kidneys are normal. The bladder is normal. Stomach/Bowel: The stomach, duodenum and small bowel are unremarkable. No acute inflammatory changes, mass lesions or obstructive findings. The colon is distended and fluid-filled, typical findings for diarrhea but I do not see any wall thickening or pericolonic inflammatory changes. The terminal ileum is unremarkable. The appendix is surgically absent. Vascular/Lymphatic: Scattered atherosclerotic calcifications involving the aorta and iliac arteries but no  aneurysm or dissection. The branch vessels are patent. The major venous structures are patent. Small scattered mesenteric and retroperitoneal lymph nodes but no mass or overt adenopathy the. Reproductive: The prostate gland and seminal vesicles are unremarkable. Other: No pelvic mass or adenopathy. No free pelvic fluid collections. No inguinal mass or adenopathy. No abdominal wall hernia or subcutaneous lesions. Musculoskeletal: The no significant bony findings. Advanced degenerative disc disease noted at L5-S1 IMPRESSION: 1. Findings suspicious for mild pancreatitis involving the pancreatic head. Recommend clinical correlation. 2. Distended fluid-filled colon typically seen with diarrhea. No obvious acute colitis. 3. No abdominal mass or lymphadenopathy. Electronically Signed   By: MYRTIS Stammer M.D.   On: 12/21/2017 10:32    No results found.  No results found.    Assessment and Plan: Patient Active Problem List   Diagnosis Date Noted   CPAP use counseling 08/14/2024   OSA (obstructive sleep apnea) 03/20/2024    Smoker 03/20/2024      The patient *** tolerate PAP and reports *** benefit from PAP use. The patient was reminded how to *** and advised to ***. The patient was also counselled on ***. The compliance is ***. The AHI is ***.   ***  General Counseling: I have discussed the findings of the evaluation and examination with Francisco Miller.  I have also discussed any further diagnostic evaluation thatmay be needed or ordered today. Francisco Miller verbalizes understanding of the findings of todays visit. We also reviewed his medications today and discussed drug interactions and side effects including but not limited excessive drowsiness and altered mental states. We also discussed that there is always a risk not just to him but also people around him. he has been encouraged to call the office with any questions or concerns that should arise related to todays visit.  No orders of the defined types were placed in this encounter.       I have personally obtained a history, examined the patient, evaluated laboratory and imaging results, formulated the assessment and plan and placed orders.  Elfreda DELENA Bathe, MD [facility] Diplomate ABMS Pulmonary Critical Care Medicine and Sleep Medicine

## 2024-11-13 ENCOUNTER — Ambulatory Visit
